# Patient Record
Sex: Female | Born: 2000 | Race: White | Hispanic: No | Marital: Single | Smoking: Never smoker
Health system: Southern US, Community
[De-identification: ages and names within clinical notes are randomized; demographics above are authoritative.]

## PROBLEM LIST (undated history)

## (undated) HISTORY — PX: TONSILLECTOMY: SUR1361

---
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
## 2021-10-24 ENCOUNTER — Ambulatory Visit
Admission: EM | Admit: 2021-10-24 | Discharge: 2021-10-24 | Disposition: A | Discharge status: Home / Self Care | Payer: 59

## 2021-10-24 ENCOUNTER — Other Ambulatory Visit: Payer: Self-pay

## 2021-10-24 ENCOUNTER — Encounter: Payer: Self-pay | Admitting: Emergency Medicine

## 2021-10-24 DIAGNOSIS — H65112 Acute and subacute allergic otitis media (mucoid) (sanguinous) (serous), left ear: Secondary | ICD-10-CM | POA: Diagnosis not present

## 2021-10-24 DIAGNOSIS — J301 Allergic rhinitis due to pollen: Secondary | ICD-10-CM

## 2021-10-24 MED ORDER — FLUTICASONE PROPIONATE 50 MCG/ACT NA SUSP: 1.0000 | Freq: Every day | NASAL | 0 refills | Status: AC

## 2021-10-24 MED ORDER — AMOXICILLIN 500 MG PO CAPS: 500.0000 mg | ORAL_CAPSULE | Freq: Three times a day (TID) | ORAL | 0 refills | Status: AC

## 2021-10-24 NOTE — ED Triage Notes (Signed)
Patient c/o left ear pain since last night, no other sx's.  Patient has taken some Ciprofloxicin ear drops which did not help. ?

## 2021-10-24 NOTE — Discharge Instructions (Signed)
You have an ear infection on the left, likely secondary to your allergies. ?Please start taking the antibiotic as prescribed, do not stop taking it early because you feel better. ?If you are sexually active, use backup birth control as antibiotics do interfere with the action of contraceptives. ?Eat yogurt and take a probiotic to prevent side effects from the antibiotic. ?Start using Flonase 1 spray up to twice daily each nostril to help with your allergy symptoms. ?Continue use of your daily Xyzal. ?Return to clinic if any symptoms persist. ?

## 2021-10-25 ENCOUNTER — Encounter: Payer: Self-pay | Admitting: Urgent Care

## 2021-10-25 NOTE — ED Provider Notes (Signed)
?[facility] ? ? ? ?CSN: [id number] ?Arrival date & time: 10/24/21  1221 ? ? ?  ? ?History   ?Chief Complaint ?Chief Complaint  ?Patient presents with  ? Otalgia  ? ? ?HPI ?Monique Sullivan is a 21 y.o. female.  ? ?Pleasant 20yo female presents today with concerns of left ear pain. She reports over the past 2-3 weeks, she has been dealing with "allergy" symptoms. Endorses post nasal drainage, congestion and rhinorrhea with intermittent sneezing. Has been taking OTC medications without significant relief. She woke up this morning with severe L sidede ear pain. She had left over ciprodex from a prior infection and tried it, but states it did not help.  ? ? ?Otalgia ?Associated symptoms: congestion and rhinorrhea   ? ?History reviewed. No pertinent past medical history. ? ?There are no problems to display for this patient. ? ? ?Past Surgical History:  ?Procedure Laterality Date  ? TONSILLECTOMY    ? ? ?OB History   ?No obstetric history on file. ?  ? ? ? ?Home Medications   ? ?Prior to Admission medications   ?Medication Sig Start Date End Date Taking? Authorizing Provider  ?amoxicillin (AMOXIL) 500 MG capsule Take 1 capsule (500 mg total) by mouth 3 (three) times daily for 10 days. 10/24/21 11/03/21 Yes Van Seymore L, PA  ?fluticasone (FLONASE) 50 MCG/ACT nasal spray Place 1 spray into both nostrils daily. 10/24/21  Yes Antwuan Eckley L, PA  ?levonorgestrel-ethinyl estradiol (NORDETTE) 0.15-30 MG-MCG tablet Take 1 tablet by mouth daily. 10/03/20  Yes [provider]  ?VYVANSE 40 MG capsule Take 40 mg by mouth every morning. 09/04/21  Yes [provider]  ? ? ?Family History ?History reviewed. No pertinent family history. ? ?Social History ?Social History  ? ?Tobacco Use  ? Smoking status: Never  ? Smokeless tobacco: Never  ?Substance Use Topics  ? Alcohol use: Never  ? Drug use: Never  ? ? ? ?Allergies   ?Patient has no known allergies. ? ? ?Review of Systems ?Review of Systems  ?HENT:  Positive for  congestion, ear pain, postnasal drip and rhinorrhea.   ? ? ?Physical Exam ?Triage Vital Signs ?ED Triage Vitals [10/24/21 1231]  ?Enc Vitals Group  ?   BP 119/80  ?   Pulse Rate 80  ?   Resp 18  ?   Temp 98 ?F (36.7 ?C)  ?   Temp Source Oral  ?   SpO2 98 %  ?   Weight 150 lb (68 kg)  ?   Height 5\' 5"  (1.651 m)  ?   Head Circumference   ?   Peak Flow   ?   Pain Score 5  ?   Pain Loc   ?   Pain Edu?   ?   Excl. in GC?   ? ?No data found. ? ?Updated Vital Signs ?BP 119/80 (BP Location: Left Arm)   Pulse 80   Temp 98 ?F (36.7 ?C) (Oral)   Resp 18   Ht 5\' 5"  (1.651 m)   Wt 150 lb (68 kg)   LMP 10/11/2021   SpO2 98%   BMI 24.96 kg/m?  ? ?Visual Acuity ?Right Eye Distance:   ?Left Eye Distance:   ?Bilateral Distance:   ? ?Right Eye Near:   ?Left Eye Near:    ?Bilateral Near:    ? ?Physical Exam ?Vitals and nursing note reviewed.  ?Constitutional:   ?   General: She is not in acute distress. ?  Appearance: Normal appearance. She is normal weight. She is not ill-appearing, toxic-appearing or diaphoretic.  ?HENT:  ?   Head: Normocephalic and atraumatic.  ?   Salivary Glands: Right salivary gland is not diffusely enlarged or tender. Left salivary gland is not diffusely enlarged or tender.  ?   Right Ear: Ear canal and external ear normal. A middle ear effusion is present. Tympanic membrane is not erythematous or bulging.  ?   Left Ear: A middle ear effusion is present. Tympanic membrane is erythematous and bulging. Tympanic membrane has decreased mobility.  ?   Nose: Rhinorrhea present. Rhinorrhea is clear.  ?   Right Turbinates: Enlarged and swollen.  ?   Left Turbinates: Enlarged and swollen.  ?   Mouth/Throat:  ?   Lips: Pink.  ?   Mouth: Mucous membranes are moist. No injury or oral lesions.  ?   Tongue: No lesions.  ?   Pharynx: Oropharynx is clear. Uvula midline. No pharyngeal swelling, oropharyngeal exudate, posterior oropharyngeal erythema or uvula swelling.  ?   Comments: Absent tonsils ?Cardiovascular:  ?    Rate and Rhythm: Normal rate and regular rhythm.  ?   Pulses: Normal pulses.  ?   Heart sounds: Normal heart sounds. No murmur heard. ?Pulmonary:  ?   Effort: Pulmonary effort is normal. No respiratory distress.  ?   Breath sounds: Normal breath sounds. No stridor. No wheezing, rhonchi or rales.  ?Musculoskeletal:  ?   Cervical back: Normal range of motion and neck supple. No rigidity or tenderness.  ?Lymphadenopathy:  ?   Cervical: No cervical adenopathy.  ?Skin: ?   General: Skin is warm.  ?   Capillary Refill: Capillary refill takes less than 2 seconds.  ?   Findings: No erythema.  ?Neurological:  ?   General: No focal deficit present.  ?   Mental Status: She is alert and oriented to person, place, and time.  ?Psychiatric:     ?   Mood and Affect: Mood normal.     ?   Behavior: Behavior normal.  ? ? ? ?UC Treatments / Results  ?Labs ?(all labs ordered are listed, but only abnormal results are displayed) ?Labs Reviewed - No data to display ? ?EKG ? ? ?Radiology ?No results found. ? ?Procedures ?Procedures (including critical care time) ? ?Medications Ordered in UC ?Medications - No data to display ? ?Initial Impression / Assessment and Plan / UC Course  ?I have reviewed the triage vital signs and the nursing notes. ? ?Pertinent labs & imaging results that were available during my care of the patient were reviewed by me and considered in my medical decision making (see chart for details). ? ?  ? ?OM L Ear - amoxicillin x 10 days.  ?Seasonal allergic rhinitis - start flonase. Suspect mid ear effusion seen on R is cause of OM on L. F/U with PCP to ensure resolution ? ?Final Clinical Impressions(s) / UC Diagnoses  ? ?Final diagnoses:  ?Non-recurrent acute allergic otitis media of left ear  ?Seasonal allergic rhinitis due to pollen  ? ? ? ?Discharge Instructions   ? ?  ?You have an ear infection on the left, likely secondary to your allergies. ?Please start taking the antibiotic as prescribed, do not stop taking it  early because you feel better. ?If you are sexually active, use backup birth control as antibiotics do interfere with the action of contraceptives. ?Eat yogurt and take a probiotic to prevent side effects from the antibiotic. ?Start  using Flonase 1 spray up to twice daily each nostril to help with your allergy symptoms. ?Continue use of your daily Xyzal. ?Return to clinic if any symptoms persist. ? ? ? ?ED Prescriptions   ? ? Medication Sig Dispense Auth. Provider  ? amoxicillin (AMOXIL) 500 MG capsule Take 1 capsule (500 mg total) by mouth 3 (three) times daily for 10 days. 30 capsule Shatima Zalar L, PA  ? fluticasone (FLONASE) 50 MCG/ACT nasal spray Place 1 spray into both nostrils daily. 16 mL Yudith Norlander L, PA  ? ?  ? ?PDMP not reviewed this encounter. ?  Maretta Bees, [place] ?10/25/21 2343 ? ?

## 2022-05-24 ENCOUNTER — Ambulatory Visit
Admission: EM | Admit: 2022-05-24 | Discharge: 2022-05-24 | Disposition: A | Discharge status: Home / Self Care | Payer: 59 | Attending: Physician Assistant | Admitting: Physician Assistant

## 2022-05-24 DIAGNOSIS — J019 Acute sinusitis, unspecified: Secondary | ICD-10-CM

## 2022-05-24 MED ORDER — AMOXICILLIN-POT CLAVULANATE 875-125 MG PO TABS: 1.0000 | ORAL_TABLET | Freq: Two times a day (BID) | ORAL | 0 refills | Status: AC

## 2022-05-24 NOTE — ED Triage Notes (Signed)
Pt presents with sinus congestion, pressure in both ears, cough, and sore throat for about a week.

## 2022-05-24 NOTE — ED Provider Notes (Signed)
[facility]    CSN: [id number] Arrival date & time: 05/24/22  1137      History   Chief Complaint Chief Complaint  Patient presents with   URI    HPI Monique Sullivan is a 21 y.o. female.   Patient here today for evaluation of sinus congestion, pressure in her ears, cough, and sore throat she has had for about a week. She has not had any nausea, vomiting, diarrhea. She has tried OTC meds without significant improvement.   The history is provided by the patient.  URI Presenting symptoms: congestion, cough and sore throat   Presenting symptoms: no ear pain and no fever   Associated symptoms: no wheezing     History reviewed. No pertinent past medical history.  There are no problems to display for this patient.   Past Surgical History:  Procedure Laterality Date   TONSILLECTOMY      OB History   No obstetric history on file.      Home Medications    Prior to Admission medications   Medication Sig Start Date End Date Taking? Authorizing Provider  amoxicillin-clavulanate (AUGMENTIN) 875-125 MG tablet Take 1 tablet by mouth every 12 (twelve) hours. 05/24/22  Yes Tomi Bamberger, PA-C  fluticasone (FLONASE) 50 MCG/ACT nasal spray Place 1 spray into both nostrils daily. 10/24/21   Crain, Alphonzo Lemmings L, PA  levonorgestrel-ethinyl estradiol (NORDETTE) 0.15-30 MG-MCG tablet Take 1 tablet by mouth daily. 10/03/20   [provider]  VYVANSE 40 MG capsule Take 40 mg by mouth every morning. 09/04/21   [provider]    Family History Family History  Family history unknown: Yes    Social History Social History   Tobacco Use   Smoking status: Never   Smokeless tobacco: Never  Substance Use Topics   Alcohol use: Never   Drug use: Never     Allergies   Patient has no known allergies.   Review of Systems Review of Systems  Constitutional:  Negative for chills and fever.  HENT:  Positive for congestion, sinus pressure and sore throat.  Negative for ear pain.   Eyes:  Negative for discharge and redness.  Respiratory:  Positive for cough. Negative for shortness of breath and wheezing.   Gastrointestinal:  Negative for abdominal pain, diarrhea, nausea and vomiting.     Physical Exam Triage Vital Signs ED Triage Vitals [05/24/22 1336]  Enc Vitals Group     BP 133/87     Pulse Rate 97     Resp 18     Temp 98.4 F (36.9 C)     Temp Source Oral     SpO2 99 %     Weight      Height      Head Circumference      Peak Flow      Pain Score 5     Pain Loc      Pain Edu?      Excl. in GC?    No data found.  Updated Vital Signs BP 133/87 (BP Location: Left Arm)   Pulse 97   Temp 98.4 F (36.9 C) (Oral)   Resp 18   LMP  (LMP Unknown)   SpO2 99%      Physical Exam Vitals and nursing note reviewed.  Constitutional:      General: She is not in acute distress.    Appearance: Normal appearance. She is not ill-appearing.  HENT:     Head: Normocephalic and atraumatic.  Nose: Congestion present.     Mouth/Throat:     Mouth: Mucous membranes are moist.     Pharynx: Posterior oropharyngeal erythema present. No oropharyngeal exudate.     Comments: PND noted Eyes:     Conjunctiva/sclera: Conjunctivae normal.  Cardiovascular:     Rate and Rhythm: Normal rate and regular rhythm.     Heart sounds: Normal heart sounds. No murmur heard. Pulmonary:     Effort: Pulmonary effort is normal. No respiratory distress.     Breath sounds: Normal breath sounds. No wheezing, rhonchi or rales.  Skin:    General: Skin is warm and dry.  Neurological:     Mental Status: She is alert.  Psychiatric:        Mood and Affect: Mood normal.        Thought Content: Thought content normal.      UC Treatments / Results  Labs (all labs ordered are listed, but only abnormal results are displayed) Labs Reviewed - No data to display  EKG   Radiology No results found.  Procedures Procedures (including critical care  time)  Medications Ordered in UC Medications - No data to display  Initial Impression / Assessment and Plan / UC Course  I have reviewed the triage vital signs and the nursing notes.  Pertinent labs & imaging results that were available during my care of the patient were reviewed by me and considered in my medical decision making (see chart for details).   Augmentin prescribed to cover suspected sinusitis. Encouraged follow up with any further concerns.    Final Clinical Impressions(s) / UC Diagnoses   Final diagnoses:  Acute sinusitis, recurrence not specified, unspecified location   Discharge Instructions   None    ED Prescriptions     Medication Sig Dispense Auth. Provider   amoxicillin-clavulanate (AUGMENTIN) 875-125 MG tablet Take 1 tablet by mouth every 12 (twelve) hours. 14 tablet Tomi Bamberger, PA-C      PDMP not reviewed this encounter.   Tomi Bamberger, PA-C 05/24/22 1404

## 2022-08-06 ENCOUNTER — Other Ambulatory Visit (HOSPITAL_COMMUNITY): Payer: Self-pay

## 2022-08-09 ENCOUNTER — Other Ambulatory Visit (HOSPITAL_COMMUNITY): Payer: Self-pay

## 2022-08-09 MED ORDER — LISDEXAMFETAMINE DIMESYLATE 40 MG PO CAPS
40.0000 mg | ORAL_CAPSULE | Freq: Every day | ORAL | 0 refills | Status: AC
  Filled 2022-08-09: qty 30, 30d supply, fill #0

## 2022-08-19 ENCOUNTER — Other Ambulatory Visit (HOSPITAL_COMMUNITY): Payer: Self-pay

## 2022-08-26 ENCOUNTER — Other Ambulatory Visit (HOSPITAL_COMMUNITY): Payer: Self-pay

## 2022-08-27 ENCOUNTER — Other Ambulatory Visit (HOSPITAL_COMMUNITY): Payer: Self-pay

## 2022-09-02 ENCOUNTER — Other Ambulatory Visit (HOSPITAL_BASED_OUTPATIENT_CLINIC_OR_DEPARTMENT_OTHER): Payer: Self-pay

## 2022-09-02 MED ORDER — LISDEXAMFETAMINE DIMESYLATE 40 MG PO CAPS
40.0000 mg | ORAL_CAPSULE | Freq: Every day | ORAL | 0 refills | Status: DC
  Filled 2022-09-02: qty 30, 30d supply, fill #0

## 2022-09-03 ENCOUNTER — Other Ambulatory Visit (HOSPITAL_BASED_OUTPATIENT_CLINIC_OR_DEPARTMENT_OTHER): Payer: Self-pay

## 2022-09-03 ENCOUNTER — Other Ambulatory Visit (HOSPITAL_COMMUNITY): Payer: Self-pay

## 2022-09-03 MED ORDER — LISDEXAMFETAMINE DIMESYLATE 40 MG PO CAPS
40.0000 mg | ORAL_CAPSULE | Freq: Every day | ORAL | 0 refills | Status: AC
  Filled 2022-09-03 (×2): qty 30, 30d supply, fill #0

## 2022-10-15 ENCOUNTER — Other Ambulatory Visit (HOSPITAL_COMMUNITY): Payer: Self-pay

## 2022-10-15 MED ORDER — LISDEXAMFETAMINE DIMESYLATE 40 MG PO CAPS
40.0000 mg | ORAL_CAPSULE | Freq: Every day | ORAL | 0 refills | Status: AC
  Filled 2022-10-15: qty 30, 30d supply, fill #0

## 2023-01-04 ENCOUNTER — Other Ambulatory Visit (HOSPITAL_COMMUNITY): Payer: Self-pay

## 2023-01-04 MED ORDER — LISDEXAMFETAMINE DIMESYLATE 40 MG PO CAPS
40.0000 mg | ORAL_CAPSULE | Freq: Every day | ORAL | 0 refills | Status: DC
  Filled 2023-01-04: qty 30, 30d supply, fill #0

## 2023-01-05 ENCOUNTER — Other Ambulatory Visit (HOSPITAL_COMMUNITY): Payer: Self-pay

## 2023-02-11 ENCOUNTER — Other Ambulatory Visit (HOSPITAL_COMMUNITY): Payer: Self-pay

## 2023-02-11 MED ORDER — LISDEXAMFETAMINE DIMESYLATE 40 MG PO CAPS
40.0000 mg | ORAL_CAPSULE | Freq: Every day | ORAL | 0 refills | Status: DC
  Filled 2023-02-11 – 2023-02-22 (×2): qty 30, 30d supply, fill #0

## 2023-02-14 ENCOUNTER — Other Ambulatory Visit (HOSPITAL_COMMUNITY): Payer: Self-pay

## 2023-02-21 ENCOUNTER — Other Ambulatory Visit (HOSPITAL_COMMUNITY): Payer: Self-pay

## 2023-02-22 ENCOUNTER — Other Ambulatory Visit (HOSPITAL_COMMUNITY): Payer: Self-pay

## 2023-03-24 ENCOUNTER — Other Ambulatory Visit (HOSPITAL_COMMUNITY): Payer: Self-pay

## 2023-03-24 MED ORDER — LISDEXAMFETAMINE DIMESYLATE 40 MG PO CAPS
40.0000 mg | ORAL_CAPSULE | Freq: Every day | ORAL | 0 refills | Status: DC
  Filled 2023-03-24 – 2023-03-26 (×2): qty 30, 30d supply, fill #0

## 2023-03-26 ENCOUNTER — Other Ambulatory Visit (HOSPITAL_COMMUNITY): Payer: Self-pay

## 2023-03-28 ENCOUNTER — Other Ambulatory Visit (HOSPITAL_COMMUNITY): Payer: Self-pay

## 2023-04-29 ENCOUNTER — Other Ambulatory Visit (HOSPITAL_COMMUNITY): Payer: Self-pay

## 2023-04-29 MED ORDER — LISDEXAMFETAMINE DIMESYLATE 40 MG PO CAPS
40.0000 mg | ORAL_CAPSULE | Freq: Every day | ORAL | 0 refills | Status: DC
  Filled 2023-04-29: qty 30, 30d supply, fill #0

## 2023-04-30 ENCOUNTER — Other Ambulatory Visit (HOSPITAL_COMMUNITY): Payer: Self-pay

## 2023-05-02 ENCOUNTER — Other Ambulatory Visit (HOSPITAL_COMMUNITY): Payer: Self-pay

## 2023-06-07 ENCOUNTER — Other Ambulatory Visit (HOSPITAL_COMMUNITY): Payer: Self-pay

## 2023-06-07 MED ORDER — LISDEXAMFETAMINE DIMESYLATE 40 MG PO CAPS
40.0000 mg | ORAL_CAPSULE | Freq: Every day | ORAL | 0 refills | Status: AC
  Filled 2023-06-07: qty 30, 30d supply, fill #0

## 2023-06-08 ENCOUNTER — Other Ambulatory Visit (HOSPITAL_COMMUNITY): Payer: Self-pay

## 2023-06-10 ENCOUNTER — Other Ambulatory Visit (HOSPITAL_COMMUNITY): Payer: Self-pay

## 2023-07-07 ENCOUNTER — Other Ambulatory Visit (HOSPITAL_COMMUNITY): Payer: Self-pay

## 2023-07-07 MED ORDER — LISDEXAMFETAMINE DIMESYLATE 50 MG PO CAPS
50.0000 mg | ORAL_CAPSULE | Freq: Every day | ORAL | 0 refills | Status: DC
  Filled 2023-07-07: qty 30, 30d supply, fill #0

## 2023-07-15 ENCOUNTER — Other Ambulatory Visit (HOSPITAL_COMMUNITY): Payer: Self-pay

## 2023-09-29 ENCOUNTER — Other Ambulatory Visit (HOSPITAL_COMMUNITY): Payer: Self-pay

## 2023-09-29 ENCOUNTER — Other Ambulatory Visit: Payer: Self-pay

## 2023-09-29 MED ORDER — LISDEXAMFETAMINE DIMESYLATE 50 MG PO CAPS
50.0000 mg | ORAL_CAPSULE | Freq: Every day | ORAL | 0 refills | Status: DC
  Filled 2023-09-29: qty 30, 30d supply, fill #0

## 2023-11-02 ENCOUNTER — Other Ambulatory Visit (HOSPITAL_COMMUNITY): Payer: Self-pay

## 2023-11-02 MED ORDER — LISDEXAMFETAMINE DIMESYLATE 50 MG PO CAPS
50.0000 mg | ORAL_CAPSULE | Freq: Every day | ORAL | 0 refills | Status: DC
  Filled 2023-11-02: qty 30, 30d supply, fill #0

## 2023-11-03 ENCOUNTER — Other Ambulatory Visit (HOSPITAL_COMMUNITY): Payer: Self-pay

## 2023-12-07 ENCOUNTER — Other Ambulatory Visit (HOSPITAL_COMMUNITY): Payer: Self-pay

## 2023-12-07 MED ORDER — LISDEXAMFETAMINE DIMESYLATE 50 MG PO CAPS
50.0000 mg | ORAL_CAPSULE | Freq: Every day | ORAL | 0 refills | Status: DC
  Filled 2023-12-07: qty 30, 30d supply, fill #0

## 2024-01-11 ENCOUNTER — Other Ambulatory Visit (HOSPITAL_COMMUNITY): Payer: Self-pay

## 2024-01-11 MED ORDER — LISDEXAMFETAMINE DIMESYLATE 50 MG PO CAPS
50.0000 mg | ORAL_CAPSULE | Freq: Every day | ORAL | 0 refills | Status: DC
  Filled 2024-01-11: qty 30, 30d supply, fill #0

## 2024-02-18 ENCOUNTER — Other Ambulatory Visit (HOSPITAL_COMMUNITY): Payer: Self-pay

## 2024-02-18 MED ORDER — LISDEXAMFETAMINE DIMESYLATE 50 MG PO CAPS
50.0000 mg | ORAL_CAPSULE | Freq: Every morning | ORAL | 0 refills | Status: DC
  Filled 2024-02-18: qty 30, 30d supply, fill #0

## 2024-03-22 ENCOUNTER — Other Ambulatory Visit (HOSPITAL_COMMUNITY): Payer: Self-pay

## 2024-03-22 MED ORDER — LISDEXAMFETAMINE DIMESYLATE 50 MG PO CAPS
50.0000 mg | ORAL_CAPSULE | Freq: Every day | ORAL | 0 refills | Status: DC
  Filled 2024-03-22: qty 30, 30d supply, fill #0

## 2024-04-18 ENCOUNTER — Other Ambulatory Visit (HOSPITAL_COMMUNITY): Payer: Self-pay

## 2024-04-18 MED ORDER — LISDEXAMFETAMINE DIMESYLATE 50 MG PO CAPS
50.0000 mg | ORAL_CAPSULE | Freq: Every day | ORAL | 0 refills | Status: DC
  Filled 2024-04-18 – 2024-04-19 (×2): qty 30, 30d supply, fill #0

## 2024-04-19 ENCOUNTER — Other Ambulatory Visit (HOSPITAL_COMMUNITY): Payer: Self-pay

## 2024-05-22 ENCOUNTER — Other Ambulatory Visit (HOSPITAL_COMMUNITY): Payer: Self-pay

## 2024-05-22 ENCOUNTER — Other Ambulatory Visit: Payer: Self-pay

## 2024-05-22 MED ORDER — LISDEXAMFETAMINE DIMESYLATE 50 MG PO CAPS
50.0000 mg | ORAL_CAPSULE | Freq: Every day | ORAL | 0 refills | Status: DC
  Filled 2024-05-22: qty 30, 30d supply, fill #0

## 2024-06-23 ENCOUNTER — Other Ambulatory Visit (HOSPITAL_COMMUNITY): Payer: Self-pay

## 2024-06-23 MED ORDER — LISDEXAMFETAMINE DIMESYLATE 50 MG PO CAPS
50.0000 mg | ORAL_CAPSULE | Freq: Every day | ORAL | 0 refills | Status: DC
  Filled 2024-06-23: qty 30, 30d supply, fill #0

## 2024-07-25 ENCOUNTER — Other Ambulatory Visit (HOSPITAL_COMMUNITY): Payer: Self-pay

## 2024-07-25 MED ORDER — LISDEXAMFETAMINE DIMESYLATE 50 MG PO CAPS
50.0000 mg | ORAL_CAPSULE | Freq: Every day | ORAL | 0 refills | Status: AC
  Filled 2024-07-25: qty 30, 30d supply, fill #0
